# Patient Record
Sex: Male | Born: 1980 | Race: Black or African American | Hispanic: No | Marital: Single | Smoking: Never smoker
Health system: Southern US, Community
[De-identification: ages and names within clinical notes are randomized; demographics above are authoritative.]

## PROBLEM LIST (undated history)

## (undated) ENCOUNTER — Emergency Department (HOSPITAL_COMMUNITY): Discharge status: Absent without leave | Payer: BC Managed Care – PPO

---

## 2006-03-18 ENCOUNTER — Emergency Department (HOSPITAL_COMMUNITY): Admission: EM | Admit: 2006-03-18 | Discharge: 2006-03-18 | Payer: Self-pay | Admitting: Emergency Medicine

## 2007-01-20 ENCOUNTER — Ambulatory Visit: Payer: Self-pay | Admitting: Internal Medicine

## 2007-01-21 ENCOUNTER — Ambulatory Visit: Payer: Self-pay | Admitting: *Deleted

## 2007-02-03 ENCOUNTER — Ambulatory Visit: Payer: Self-pay | Admitting: Internal Medicine

## 2007-02-18 ENCOUNTER — Ambulatory Visit: Payer: Self-pay | Admitting: Internal Medicine

## 2009-06-10 ENCOUNTER — Emergency Department (HOSPITAL_COMMUNITY): Admission: EM | Admit: 2009-06-10 | Discharge: 2009-06-11 | Payer: Self-pay | Admitting: Emergency Medicine

## 2009-06-15 ENCOUNTER — Emergency Department (HOSPITAL_COMMUNITY): Admission: EM | Admit: 2009-06-15 | Discharge: 2009-06-15 | Payer: Self-pay | Admitting: Emergency Medicine

## 2011-09-03 ENCOUNTER — Emergency Department (HOSPITAL_COMMUNITY)
Admission: EM | Admit: 2011-09-03 | Discharge: 2011-09-03 | Disposition: A | Discharge status: Home / Self Care | Payer: No Typology Code available for payment source | Attending: Emergency Medicine | Admitting: Emergency Medicine

## 2011-09-03 ENCOUNTER — Emergency Department (HOSPITAL_COMMUNITY): Payer: No Typology Code available for payment source

## 2011-09-03 ENCOUNTER — Encounter (HOSPITAL_COMMUNITY): Payer: Self-pay | Admitting: Emergency Medicine

## 2011-09-03 DIAGNOSIS — S43109A Unspecified dislocation of unspecified acromioclavicular joint, initial encounter: Secondary | ICD-10-CM | POA: Insufficient documentation

## 2011-09-03 DIAGNOSIS — Y92410 Unspecified street and highway as the place of occurrence of the external cause: Secondary | ICD-10-CM | POA: Insufficient documentation

## 2011-09-03 DIAGNOSIS — M542 Cervicalgia: Secondary | ICD-10-CM | POA: Insufficient documentation

## 2011-09-03 MED ORDER — OXYCODONE-ACETAMINOPHEN 5-325 MG PO TABS
1.0000 | ORAL_TABLET | Freq: Once | ORAL | Status: AC
  Administered 2011-09-03: 1 via ORAL
  Filled 2011-09-03: qty 1

## 2011-09-03 MED ORDER — IBUPROFEN 800 MG PO TABS: 800.0000 mg | ORAL_TABLET | Freq: Three times a day (TID) | ORAL | Status: AC | PRN

## 2011-09-03 MED ORDER — IBUPROFEN 200 MG PO TABS
600.0000 mg | ORAL_TABLET | Freq: Once | ORAL | Status: AC
  Administered 2011-09-03: 600 mg via ORAL
  Filled 2011-09-03: qty 3

## 2011-09-03 MED ORDER — DIAZEPAM 5 MG PO TABS: 5.0000 mg | ORAL_TABLET | Freq: Three times a day (TID) | ORAL | Status: AC | PRN

## 2011-09-03 MED ORDER — IBUPROFEN 200 MG PO TABS
ORAL_TABLET | ORAL | Status: AC
  Filled 2011-09-03: qty 1

## 2011-09-03 MED ORDER — OXYCODONE-ACETAMINOPHEN 5-325 MG PO TABS: 1.0000 | ORAL_TABLET | Freq: Four times a day (QID) | ORAL | Status: AC | PRN

## 2011-09-03 MED ORDER — DIAZEPAM 5 MG PO TABS
5.0000 mg | ORAL_TABLET | Freq: Once | ORAL | Status: AC
  Administered 2011-09-03: 5 mg via ORAL
  Filled 2011-09-03: qty 1

## 2011-09-03 NOTE — ED Provider Notes (Signed)
Medical screening examination/treatment/procedure(s) were performed by non-physician practitioner and as supervising physician I was immediately available for consultation/collaboration.  Juliet Rude. Rubin Payor, MD 09/03/11 2358
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
## 2011-09-03 NOTE — ED Notes (Signed)
Pt returned from xray
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
## 2012-06-27 ENCOUNTER — Emergency Department (INDEPENDENT_AMBULATORY_CARE_PROVIDER_SITE_OTHER)
Admission: EM | Admit: 2012-06-27 | Discharge: 2012-06-27 | Disposition: A | Discharge status: Home / Self Care | Payer: Self-pay | Source: Home / Self Care | Attending: Family Medicine | Admitting: Family Medicine

## 2012-06-27 ENCOUNTER — Encounter (HOSPITAL_COMMUNITY): Payer: Self-pay | Admitting: Emergency Medicine

## 2012-06-27 DIAGNOSIS — M12819 Other specific arthropathies, not elsewhere classified, unspecified shoulder: Secondary | ICD-10-CM

## 2012-06-27 MED ORDER — DICLOFENAC SODIUM 1 % TD GEL: 4.0000 g | Freq: Four times a day (QID) | TRANSDERMAL | Status: DC

## 2012-06-27 NOTE — ED Provider Notes (Signed)
History     CSN: [id number]  Arrival date & time 06/27/12  1418   First MD Initiated Contact with Patient 06/27/12 1452      Chief Complaint  Patient presents with  . Shoulder Pain    (Consider location/radiation/quality/duration/timing/severity/associated sxs/prior treatment) Patient is a 32 y.o. male presenting with shoulder pain. The history is provided by the patient.  Shoulder Pain This is a chronic problem. The current episode started more than 1 week ago (74mo ago wrestling with friends in so [place], shoulder has hurt since.). The problem has not changed since onset.   History reviewed. No pertinent past medical history.  History reviewed. No pertinent past surgical history.  No family history on file.  History  Substance Use Topics  . Smoking status: Never Smoker   . Smokeless tobacco: Not on file  . Alcohol Use: No      Review of Systems  Constitutional: Negative.   Musculoskeletal: Positive for joint swelling.    Allergies  Review of patient's allergies indicates no known allergies.  Home Medications   Current Outpatient Rx  Name  Route  Sig  Dispense  Refill  . DICLOFENAC SODIUM 1 % TD GEL   Topical   Apply 4 g topically 4 (four) times daily.   3 Tube   0   . MAGNESIUM CHLORIDE 64 MG PO TBEC   Oral   Take 2 tablets by mouth 2 (two) times daily.           BP 135/89  Pulse 65  Temp 98.5 F (36.9 C) (Oral)  Resp 20  SpO2 100%  Physical Exam  Nursing note and vitals reviewed. Constitutional: He is oriented to person, place, and time. He appears well-developed and well-nourished. No distress.  Musculoskeletal: He exhibits tenderness.       Left shoulder: He exhibits tenderness and swelling. He exhibits normal range of motion, no bony tenderness, no effusion, no crepitus, no deformity, no pain and normal pulse.       Arms: Neurological: He is alert and oriented to person, place, and time.  Skin: Skin is warm and dry.    ED Course    Procedures (including critical care time)  Labs Reviewed - No data to display No results found.   1. Transient arthropathy, shoulder region       MDM          Linna Hoff, MD 06/27/12 1545
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
## 2013-12-17 ENCOUNTER — Emergency Department (HOSPITAL_COMMUNITY)
Admission: EM | Admit: 2013-12-17 | Discharge: 2013-12-17 | Disposition: A | Discharge status: Home / Self Care | Payer: BC Managed Care – PPO | Source: Home / Self Care | Attending: Family Medicine | Admitting: Family Medicine

## 2013-12-17 ENCOUNTER — Encounter (HOSPITAL_COMMUNITY): Payer: Self-pay | Admitting: Emergency Medicine

## 2013-12-17 DIAGNOSIS — J069 Acute upper respiratory infection, unspecified: Secondary | ICD-10-CM

## 2013-12-17 MED ORDER — DICLOFENAC SODIUM 50 MG PO TBEC: 50.0000 mg | DELAYED_RELEASE_TABLET | Freq: Two times a day (BID) | ORAL | Status: DC | PRN

## 2013-12-17 NOTE — ED Notes (Signed)
Patient complains of headache Dizzy and nausea the past three days

## 2013-12-17 NOTE — ED Provider Notes (Signed)
Darrell Lozano is a 33 y.o. male who presents to Urgent Care today for headache cough congestion mild upset stomach and fatigue. Symptoms present for 2 days. No nausea vomiting or diarrhea. He has not tried any medications yet. He denies any shortness of breath chest pain or palpitations. He feels well otherwise. Patient has mild dizziness. He denies any syncope or vertigo.   History reviewed. No pertinent past medical history. History  Substance Use Topics  . Smoking status: Never Smoker   . Smokeless tobacco: Not on file  . Alcohol Use: No   ROS as above Medications: No current facility-administered medications for this encounter.   Current Outpatient Prescriptions  Medication Sig Dispense Refill  . diclofenac (VOLTAREN) 50 MG EC tablet Take 1 tablet (50 mg total) by mouth 2 (two) times daily as needed.  30 tablet  0  . diclofenac sodium (VOLTAREN) 1 % GEL Apply 4 g topically 4 (four) times daily.  3 Tube  0  . magnesium chloride (SLOW-MAG) 64 MG TBEC Take 2 tablets by mouth 2 (two) times daily.        Exam:  BP 142/86  Pulse 56  Temp(Src) 98.6 F (37 C) (Oral)  Resp 16  SpO2 99% Gen: Well NAD HEENT: EOMI,  MMM PERRLA normal posterior pharynx and tympanic membranes Lungs: Normal work of breathing. CTABL Heart: RRR no MRG Abd: NABS, Soft. Nondistended, Nontender Exts: Brisk capillary refill, warm and well perfused.  Neuro: Alert and oriented cranial nerves intact normal coordination strength reflexes sensation gait and balance.  No results found for this or any previous visit (from the past 24 hour(s)). No results found.  Assessment and Plan: 33 y.o. male with viral URI. Plan for symptomatic management with diclofenac. Watchful waiting. Followup with primary care  Discussed warning signs or symptoms. Please see discharge instructions. Patient expresses understanding.   This note was created using Conservation officer, historic buildingsDragon voice recognition software. Any transcription errors are unintended.     Rodolph BongEvan S Stephanie Mcglone, MD 12/17/13 [id number]

## 2013-12-17 NOTE — Discharge Instructions (Signed)
Thank you for coming in today. Go to the emergency room if your headache becomes excruciating or you have weakness or numbness or uncontrolled vomiting.   Upper Respiratory Infection, Adult An upper respiratory infection (URI) is also sometimes known as the common cold. The upper respiratory tract includes the nose, sinuses, throat, trachea, and bronchi. Bronchi are the airways leading to the lungs. Most people improve within 1 week, but symptoms can last up to 2 weeks. A residual cough may last even longer.  CAUSES Many different viruses can infect the tissues lining the upper respiratory tract. The tissues become irritated and inflamed and often become very moist. Mucus production is also common. A cold is contagious. You can easily spread the virus to others by oral contact. This includes kissing, sharing a glass, coughing, or sneezing. Touching your mouth or nose and then touching a surface, which is then touched by another person, can also spread the virus. SYMPTOMS  Symptoms typically develop 1 to 3 days after you come in contact with a cold virus. Symptoms vary from person to person. They may include:  Runny nose.  Sneezing.  Nasal congestion.  Sinus irritation.  Sore throat.  Loss of voice (laryngitis).  Cough.  Fatigue.  Muscle aches.  Loss of appetite.  Headache.  Low-grade fever. DIAGNOSIS  You might diagnose your own cold based on familiar symptoms, since most people get a cold 2 to 3 times a year. Your caregiver can confirm this based on your exam. Most importantly, your caregiver can check that your symptoms are not due to another disease such as strep throat, sinusitis, pneumonia, asthma, or epiglottitis. Blood tests, throat tests, and X-rays are not necessary to diagnose a common cold, but they may sometimes be helpful in excluding other more serious diseases. Your caregiver will decide if any further tests are required. RISKS AND COMPLICATIONS  You may be at risk  for a more severe case of the common cold if you smoke cigarettes, have chronic heart disease (such as heart failure) or lung disease (such as asthma), or if you have a weakened immune system. The very young and very old are also at risk for more serious infections. Bacterial sinusitis, middle ear infections, and bacterial pneumonia can complicate the common cold. The common cold can worsen asthma and chronic obstructive pulmonary disease (COPD). Sometimes, these complications can require emergency medical care and may be life-threatening. PREVENTION  The best way to protect against getting a cold is to practice good hygiene. Avoid oral or hand contact with people with cold symptoms. Wash your hands often if contact occurs. There is no clear evidence that vitamin C, vitamin E, echinacea, or exercise reduces the chance of developing a cold. However, it is always recommended to get plenty of rest and practice good nutrition. TREATMENT  Treatment is directed at relieving symptoms. There is no cure. Antibiotics are not effective, because the infection is caused by a virus, not by bacteria. Treatment may include:  Increased fluid intake. Sports drinks offer valuable electrolytes, sugars, and fluids.  Breathing heated mist or steam (vaporizer or shower).  Eating chicken soup or other clear broths, and maintaining good nutrition.  Getting plenty of rest.  Using gargles or lozenges for comfort.  Controlling fevers with ibuprofen or acetaminophen as directed by your caregiver.  Increasing usage of your inhaler if you have asthma. Zinc gel and zinc lozenges, taken in the first 24 hours of the common cold, can shorten the duration and lessen the severity of  symptoms. Pain medicines may help with fever, muscle aches, and throat pain. A variety of non-prescription medicines are available to treat congestion and runny nose. Your caregiver can make recommendations and may suggest nasal or lung inhalers for other  symptoms.  HOME CARE INSTRUCTIONS   Only take over-the-counter or prescription medicines for pain, discomfort, or fever as directed by your caregiver.  Use a warm mist humidifier or inhale steam from a shower to increase air moisture. This may keep secretions moist and make it easier to breathe.  Drink enough water and fluids to keep your urine clear or pale yellow.  Rest as needed.  Return to work when your temperature has returned to normal or as your caregiver advises. You may need to stay home longer to avoid infecting others. You can also use a face mask and careful hand washing to prevent spread of the virus. SEEK MEDICAL CARE IF:   After the first few days, you feel you are getting worse rather than better.  You need your caregiver's advice about medicines to control symptoms.  You develop chills, worsening shortness of breath, or brown or red sputum. These may be signs of pneumonia.  You develop yellow or brown nasal discharge or pain in the face, especially when you bend forward. These may be signs of sinusitis.  You develop a fever, swollen neck glands, pain with swallowing, or white areas in the back of your throat. These may be signs of strep throat. SEEK IMMEDIATE MEDICAL CARE IF:   You have a fever.  You develop severe or persistent headache, ear pain, sinus pain, or chest pain.  You develop wheezing, a prolonged cough, cough up blood, or have a change in your usual mucus (if you have chronic lung disease).  You develop sore muscles or a stiff neck. Document Released: 11/11/2000 Document Revised: 08/10/2011 Document Reviewed: 09/19/2010 [facility] Patient Information 2015 [place]. This information is not intended to replace advice given to you by your health care provider. Make sure you discuss any questions you have with your health care provider.

## 2014-11-29 ENCOUNTER — Ambulatory Visit (INDEPENDENT_AMBULATORY_CARE_PROVIDER_SITE_OTHER): Payer: Worker's Compensation | Admitting: Family

## 2014-11-29 ENCOUNTER — Encounter: Payer: Self-pay | Admitting: Family

## 2014-11-29 VITALS — BP 120/81 | HR 70 | Temp 98.4°F | Ht 74.0 in

## 2014-11-29 DIAGNOSIS — S41112A Laceration without foreign body of left upper arm, initial encounter: Secondary | ICD-10-CM | POA: Diagnosis not present

## 2014-11-29 DIAGNOSIS — Z23 Encounter for immunization: Secondary | ICD-10-CM | POA: Diagnosis not present

## 2014-11-29 DIAGNOSIS — IMO0002 Reserved for concepts with insufficient information to code with codable children: Secondary | ICD-10-CM

## 2014-11-29 MED ORDER — SULFAMETHOXAZOLE-TRIMETHOPRIM 800-160 MG PO TABS: 1.0000 | ORAL_TABLET | Freq: Two times a day (BID) | ORAL | Status: DC

## 2014-11-29 NOTE — Patient Instructions (Signed)
Laceration Care, Adult A laceration is a cut or lesion that goes through all layers of the skin and into the tissue just beneath the skin. TREATMENT  Some lacerations may not require closure. Some lacerations may not be able to be closed due to an increased risk of infection. It is important to see your caregiver as soon as possible after an injury to minimize the risk of infection and maximize the opportunity for successful closure. If closure is appropriate, pain medicines may be given, if needed. The wound will be cleaned to help prevent infection. Your caregiver will use stitches (sutures), staples, wound glue (adhesive), or skin adhesive strips to repair the laceration. These tools bring the skin edges together to allow for faster healing and a better cosmetic outcome. However, all wounds will heal with a scar. Once the wound has healed, scarring can be minimized by covering the wound with sunscreen during the day for 1 full year. HOME CARE INSTRUCTIONS  For sutures or staples:  Keep the wound clean and dry.  If you were given a bandage (dressing), you should change it at least once a day. Also, change the dressing if it becomes wet or dirty, or as directed by your caregiver.  Wash the wound with soap and water 2 times a day. Rinse the wound off with water to remove all soap. Pat the wound dry with a clean towel.  After cleaning, apply a thin layer of the antibiotic ointment as recommended by your caregiver. This will help prevent infection and keep the dressing from sticking.  You may shower as usual after the first 24 hours. Do not soak the wound in water until the sutures are removed.  Only take over-the-counter or prescription medicines for pain, discomfort, or fever as directed by your caregiver.  Get your sutures or staples removed as directed by your caregiver. For skin adhesive strips:  Keep the wound clean and dry.  Do not get the skin adhesive strips wet. You may bathe  carefully, using caution to keep the wound dry.  If the wound gets wet, pat it dry with a clean towel.  Skin adhesive strips will fall off on their own. You may trim the strips as the wound heals. Do not remove skin adhesive strips that are still stuck to the wound. They will fall off in time. For wound adhesive:  You may briefly wet your wound in the shower or bath. Do not soak or scrub the wound. Do not swim. Avoid periods of heavy perspiration until the skin adhesive has fallen off on its own. After showering or bathing, gently pat the wound dry with a clean towel.  Do not apply liquid medicine, cream medicine, or ointment medicine to your wound while the skin adhesive is in place. This may loosen the film before your wound is healed.  If a dressing is placed over the wound, be careful not to apply tape directly over the skin adhesive. This may cause the adhesive to be pulled off before the wound is healed.  Avoid prolonged exposure to sunlight or tanning lamps while the skin adhesive is in place. Exposure to ultraviolet light in the first year will darken the scar.  The skin adhesive will usually remain in place for 5 to 10 days, then naturally fall off the skin. Do not pick at the adhesive film. You may need a tetanus shot if:  You cannot remember when you had your last tetanus shot.  You have never had a tetanus  shot. If you get a tetanus shot, your arm may swell, get red, and feel warm to the touch. This is common and not a problem. If you need a tetanus shot and you choose not to have one, there is a rare chance of getting tetanus. Sickness from tetanus can be serious. SEEK MEDICAL CARE IF:   You have redness, swelling, or increasing pain in the wound.  You see a red line that goes away from the wound.  You have yellowish-white fluid (pus) coming from the wound.  You have a fever.  You notice a bad smell coming from the wound or dressing.  Your wound breaks open before or  after sutures have been removed.  You notice something coming out of the wound such as wood or glass.  Your wound is on your hand or foot and you cannot move a finger or toe. SEEK IMMEDIATE MEDICAL CARE IF:   Your pain is not controlled with prescribed medicine.  You have severe swelling around the wound causing pain and numbness or a change in color in your arm, hand, leg, or foot.  Your wound splits open and starts bleeding.  You have worsening numbness, weakness, or loss of function of any joint around or beyond the wound.  You develop painful lumps near the wound or on the skin anywhere on your body. MAKE SURE YOU:   Understand these instructions.  Will watch your condition.  Will get help right away if you are not doing well or get worse. Document Released: 05/18/2005 Document Revised: 08/10/2011 Document Reviewed: 11/11/2010 [facility] Patient Information 2015 [place], [place]. This information is not intended to replace advice given to you by your health care provider. Make sure you discuss any questions you have with your health care provider.  Diphtheria Toxoid; Tetanus Toxoid Adsorbed, DT, Td What is this medicine? DIPHTHERIA AND TETANUS TOXOIDS ADSORBED (dif THEER ee uh and TET n [place] TOK soids ad SAWRB) is a vaccine. It is used to prevent infections of diphtheria and tetanus (lockjaw). This medicine may be used for other purposes; ask your health care provider or pharmacist if you have questions. COMMON BRAND NAME(S): DECAVAC, TENIVAC What should I tell my health care provider before I take this medicine? They need to know if you have any of these conditions: -bleeding disorder -immune system problems -infection with fever -low levels of platelets in the blood -an unusual or allergic reaction to diphtheria or tetanus toxoid, latex, thimerosal, other medicines, foods, dyes, or preservatives -pregnant or trying to get pregnant -breast-feeding How should I use this  medicine? This vaccine is for injection into a muscle. It is given by a health care professional. A copy of Vaccine Information Statements will be given before each vaccination. Read this sheet carefully each time. The sheet may change frequently. Talk to your pediatrician regarding the use of this medicine in children. While this drug may be prescribed for selected conditions, precautions do apply. Overdosage: If you think you have taken too much of this medicine contact a poison control center or emergency room at once. NOTE: This medicine is only for you. Do not share this medicine with others. What if I miss a dose? Keep appointments for follow-up (booster) doses as directed. It is important not to miss your dose. Call your doctor or health care professional if you are unable to keep an appointment. What may interact with this medicine? -adalimumab -anakinra -infliximab -live vaccines -medicines that suppress your immune system -medicines to treat cancer -  medicines that treat or prevent blood clots like daily aspirin, enoxaparin, heparin, ticlopidine, warfarin -radiopharmaceuticals like iodine I-125 or I-131 This list may not describe all possible interactions. Give your health care provider a list of all the medicines, herbs, non-prescription drugs, or dietary supplements you use. Also tell them if you smoke, drink alcohol, or use illegal drugs. Some items may interact with your medicine. What should I watch for while using this medicine? Contact your doctor or health care professional and seek emergency medical care if any serious side effects occur. This vaccine, like all vaccines, may not fully protect everyone. What side effects may I notice from receiving this medicine? Side effects that you should report to your doctor or health care professional as soon as possible: -allergic reactions like skin rash, itching or hives, swelling of the face, lips, or tongue -arthritis  pain -breathing problems -changes in hearing -extreme changes in behavior -fast, irregular heartbeat -fever over 100 degrees F -pain, tingling, numbness in the hands or feet -seizures -unusually weak or tired Side effects that usually do not require medical attention (report to your doctor or health care professional if they continue or are bothersome): -aches or pains -bruising, pain, swelling at site where injected -headache -loss of appetite -low-grade fever of 100 degrees F or less -nausea, vomiting -sleepy -swollen glands This list may not describe all possible side effects. Call your doctor for medical advice about side effects. You may report side effects to FDA at 1-800-FDA-1088. Where should I keep my medicine? This drug is given in a hospital or clinic and will not be stored at home. NOTE: This sheet is a summary. It may not cover all possible information. If you have questions about this medicine, talk to your doctor, pharmacist, or health care provider.  2015, Elsevier/Gold Standard. (2007-09-15 13:57:36)
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
## 2014-12-10 ENCOUNTER — Encounter: Payer: Self-pay | Admitting: Family

## 2014-12-10 ENCOUNTER — Ambulatory Visit (INDEPENDENT_AMBULATORY_CARE_PROVIDER_SITE_OTHER): Payer: Worker's Compensation | Admitting: Family

## 2014-12-10 VITALS — BP 124/83 | HR 65 | Temp 97.7°F | Ht 74.0 in | Wt 236.6 lb

## 2014-12-10 DIAGNOSIS — Z4802 Encounter for removal of sutures: Secondary | ICD-10-CM

## 2014-12-10 DIAGNOSIS — S41112D Laceration without foreign body of left upper arm, subsequent encounter: Secondary | ICD-10-CM | POA: Diagnosis not present

## 2014-12-10 NOTE — Progress Notes (Signed)
   Subjective:    Patient ID: Darrell Lozano, male    DOB: 06/04/1980, 34 y.o.   MRN: [id number]  HPI Pt presents to the office today for 8 sutures to be removed. Pt denies any fever, drainage, redness, or warmth.    Review of Systems  Constitutional: Negative.   HENT: Negative.   Respiratory: Negative.   Cardiovascular: Negative.   Gastrointestinal: Negative.   Endocrine: Negative.   Genitourinary: Negative.   Musculoskeletal: Negative.   Neurological: Negative.   Hematological: Negative.   Psychiatric/Behavioral: Negative.   All other systems reviewed and are negative.      Objective:   Physical Exam  Constitutional: He is oriented to person, place, and time. He appears well-developed and well-nourished. No distress.  HENT:  Head: Normocephalic.  Right Ear: External ear normal.  Left Ear: External ear normal.  Mouth/Throat: Oropharynx is clear and moist.  Eyes: Pupils are equal, round, and reactive to light. Right eye exhibits no discharge. Left eye exhibits no discharge.  Neck: Normal range of motion. Neck supple. No thyromegaly present.  Cardiovascular: Normal rate, regular rhythm, normal heart sounds and intact distal pulses.   No murmur heard. Pulmonary/Chest: Effort normal and breath sounds normal. No respiratory distress. He has no wheezes.  Abdominal: Soft. Bowel sounds are normal. He exhibits no distension. There is no tenderness.  Musculoskeletal: Normal range of motion. He exhibits no edema or tenderness.  Neurological: He is alert and oriented to person, place, and time. He has normal reflexes. No cranial nerve deficit.  Skin: Skin is warm and dry. Laceration noted. No rash noted. No erythema.  8 sutures present in left upper arm- No drainage, redness, or warmth present at site  Psychiatric: He has a normal mood and affect. His behavior is normal. Judgment and thought content normal.  Vitals reviewed.   BP 124/83 mmHg  Pulse 65  Temp(Src) 97.7 F (36.5 C)  (Oral)  Ht 6\' 2"  (1.88 m)  Wt 236 lb 9.6 oz (107.321 kg)  BMI 30.36 kg/m2       Assessment & Plan:  1. Visit for suture removal -Keep area clean and dry -S/S of infection discussed -RTO prn  Jannifer Rodneyhristy Javoni Lucken, FNP

## 2014-12-10 NOTE — Patient Instructions (Signed)

## 2014-12-12 ENCOUNTER — Telehealth: Payer: Self-pay | Admitting: Family

## 2014-12-12 NOTE — Telephone Encounter (Signed)
Spoke with pt regarding referral Informed pt since new problem [facility] Pt verbalizes understanding

## 2015-11-23 ENCOUNTER — Emergency Department (HOSPITAL_COMMUNITY)
Admission: EM | Admit: 2015-11-23 | Discharge: 2015-11-23 | Disposition: A | Discharge status: Home / Self Care | Payer: No Typology Code available for payment source | Attending: Emergency Medicine | Admitting: Emergency Medicine

## 2015-11-23 ENCOUNTER — Emergency Department (HOSPITAL_COMMUNITY): Payer: No Typology Code available for payment source

## 2015-11-23 ENCOUNTER — Encounter (HOSPITAL_COMMUNITY): Payer: Self-pay | Admitting: Emergency Medicine

## 2015-11-23 DIAGNOSIS — Y999 Unspecified external cause status: Secondary | ICD-10-CM | POA: Diagnosis not present

## 2015-11-23 DIAGNOSIS — Y939 Activity, unspecified: Secondary | ICD-10-CM | POA: Insufficient documentation

## 2015-11-23 DIAGNOSIS — M25512 Pain in left shoulder: Secondary | ICD-10-CM | POA: Insufficient documentation

## 2015-11-23 DIAGNOSIS — M62838 Other muscle spasm: Secondary | ICD-10-CM

## 2015-11-23 DIAGNOSIS — Y92410 Unspecified street and highway as the place of occurrence of the external cause: Secondary | ICD-10-CM | POA: Insufficient documentation

## 2015-11-23 DIAGNOSIS — M79605 Pain in left leg: Secondary | ICD-10-CM | POA: Diagnosis not present

## 2015-11-23 DIAGNOSIS — M542 Cervicalgia: Secondary | ICD-10-CM | POA: Diagnosis not present

## 2015-11-23 MED ORDER — IBUPROFEN 800 MG PO TABS
800.0000 mg | ORAL_TABLET | Freq: Once | ORAL | Status: AC
  Administered 2015-11-23: 800 mg via ORAL
  Filled 2015-11-23: qty 1

## 2015-11-23 MED ORDER — IBUPROFEN 800 MG PO TABS: 800.0000 mg | ORAL_TABLET | Freq: Three times a day (TID) | ORAL | Status: DC | PRN

## 2015-11-23 MED ORDER — METHOCARBAMOL 500 MG PO TABS: 500.0000 mg | ORAL_TABLET | Freq: Three times a day (TID) | ORAL | Status: DC | PRN
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
## 2017-07-18 ENCOUNTER — Other Ambulatory Visit: Payer: Self-pay

## 2017-07-18 ENCOUNTER — Ambulatory Visit (HOSPITAL_COMMUNITY)
Admission: EM | Admit: 2017-07-18 | Discharge: 2017-07-18 | Disposition: A | Discharge status: Home / Self Care | Payer: BLUE CROSS/BLUE SHIELD | Attending: Internal Medicine | Admitting: Internal Medicine

## 2017-07-18 ENCOUNTER — Ambulatory Visit (HOSPITAL_COMMUNITY)
Admission: EM | Admit: 2017-07-18 | Discharge: 2017-07-18 | Discharge status: Against Medical Advice | Payer: BLUE CROSS/BLUE SHIELD

## 2017-07-18 ENCOUNTER — Encounter (HOSPITAL_COMMUNITY): Payer: Self-pay | Admitting: *Deleted

## 2017-07-18 DIAGNOSIS — J111 Influenza due to unidentified influenza virus with other respiratory manifestations: Secondary | ICD-10-CM

## 2017-07-18 DIAGNOSIS — R69 Illness, unspecified: Secondary | ICD-10-CM

## 2017-07-18 DIAGNOSIS — H6692 Otitis media, unspecified, left ear: Secondary | ICD-10-CM

## 2017-07-18 MED ORDER — AMOXICILLIN 875 MG PO TABS: 875.0000 mg | ORAL_TABLET | Freq: Two times a day (BID) | ORAL | 0 refills | Status: AC

## 2017-07-18 MED ORDER — ONDANSETRON HCL 4 MG PO TABS: 8.0000 mg | ORAL_TABLET | ORAL | 0 refills | Status: DC | PRN

## 2017-07-18 NOTE — ED Notes (Signed)
Per pt access, pt left 

## 2017-07-18 NOTE — Discharge Instructions (Addendum)
Anticipate gradual improvement in diarrhea, runny nose/head congestion, and well-being, over the next several days.  It may take a couple of weeks for bowel pattern to return to normal.  Prescriptions for Zofran, for nausea, and for amoxicillin, for left ear infection, sent to the pharmacy.  Note for work, return to work on Tuesday 2/19.  Push fluids and rest.  Diet as tolerated.

## 2017-07-18 NOTE — ED Triage Notes (Signed)
C/O cough, congestion x 4 days with tactile fever.  C/O diarrhea x 3 days.  Denies vomiting.

## 2017-07-18 NOTE — ED Provider Notes (Signed)
[facility]    CSN: [id number] Arrival date & time: 07/18/17  1507     History   Chief Complaint Chief Complaint  Patient presents with  . Cough  . Fever  . Diarrhea    HPI Darrell Lozano is a 37 y.o. male.   He presents today with 3-4-day history of chills and tactile temperature, runny/congested nose, little bit of cough, little bit of sore throat, diarrhea.  Malaise.  Works in [employer], and has missed a couple days of work.  Has some nausea but is keeping down water and ginger ale.    HPI  History reviewed. No pertinent past medical history.  History reviewed. No pertinent surgical history.     Home Medications    Prior to Admission medications   Medication Sig Start Date End Date Taking? Authorizing Provider  amoxicillin (AMOXIL) 875 MG tablet Take 1 tablet (875 mg total) by mouth 2 (two) times daily for 7 days. 07/18/17 07/25/17  Isa RankinMurray, Damiya Sandefur Wilson, MD  ibuprofen (ADVIL,MOTRIN) 800 MG tablet Take 1 tablet (800 mg total) by mouth every 8 (eight) hours as needed for mild pain. 11/23/15   Ward, Layla MawKristen N, DO  methocarbamol (ROBAXIN) 500 MG tablet Take 1 tablet (500 mg total) by mouth every 8 (eight) hours as needed for muscle spasms. 11/23/15   Ward, Layla MawKristen N, DO  ondansetron (ZOFRAN) 4 MG tablet Take 2 tablets (8 mg total) by mouth every 4 (four) hours as needed for nausea or vomiting. 07/18/17   Isa RankinMurray, Cammeron Greis Wilson, MD    Family History History reviewed. No pertinent family history.  Social History Social History   Tobacco Use  . Smoking status: Never Smoker  . Smokeless tobacco: Never Used  Substance Use Topics  . Alcohol use: No  . Drug use: No     Allergies   Patient has no known allergies.   Review of Systems Review of Systems  All other systems reviewed and are negative.    Physical Exam Triage Vital Signs ED Triage Vitals  Enc Vitals Group     BP 07/18/17 1616 (!) 141/92     Pulse Rate 07/18/17 1616 81     Resp 07/18/17 1616 16       Temp 07/18/17 1616 (!) 97.3 F (36.3 C)     Temp Source 07/18/17 1616 Oral     SpO2 07/18/17 1616 97 %     Weight --      Height --      Pain Score 07/18/17 1617 8     Pain Loc --    Updated Vital Signs BP (!) 141/92   Pulse 81   Temp (!) 97.3 F (36.3 C) (Oral)   Resp 16   SpO2 97%   Physical Exam  Constitutional: He is oriented to person, place, and time. No distress.  Alert, nicely groomed  HENT:  Head: Atraumatic.  Left TM is dull and red, right TM is dull Marked nasal congestion on the left, mild to moderate congestion on the right Throat is injected with clear postnasal drainage  Eyes:  Conjugate gaze, no eye redness/drainage  Neck: Neck supple.  Cardiovascular: Normal rate and regular rhythm.  Pulmonary/Chest: No respiratory distress. He has no wheezes. He has no rales.  Lungs clear, symmetric breath sounds  Abdominal: Soft. He exhibits no distension. There is no tenderness. There is no rebound and no guarding.  Musculoskeletal: Normal range of motion.  Neurological: He is alert and oriented to person, place, and time.  Skin: Skin is warm and dry.  No cyanosis  Nursing note and vitals reviewed.    UC Treatments / Results   Procedures Procedures (including critical care time) None today  Final Clinical Impressions(s) / UC Diagnoses   Final diagnoses:  Influenza-like illness  Acute left otitis media   Anticipate gradual improvement in diarrhea, runny nose/head congestion, and well-being, over the next several days.  It may take a couple of weeks for bowel pattern to return to normal.  Prescriptions for Zofran, for nausea, and for amoxicillin, for left ear infection, sent to the pharmacy.  Note for work, return to work on Tuesday 2/19.  Push fluids and rest.  Diet as tolerated.  ED Discharge Orders        Ordered    ondansetron (ZOFRAN) 4 MG tablet  Every 4 hours PRN     07/18/17 1654    amoxicillin (AMOXIL) 875 MG tablet  2 times daily     07/18/17  1654         Isa Rankin, MD 07/19/17 2130

## 2018-06-03 ENCOUNTER — Encounter: Payer: Self-pay | Admitting: Family

## 2019-06-04 ENCOUNTER — Ambulatory Visit (HOSPITAL_COMMUNITY)
Admission: EM | Admit: 2019-06-04 | Discharge: 2019-06-04 | Disposition: A | Discharge status: Home / Self Care | Payer: BC Managed Care – PPO | Attending: Urgent Care | Admitting: Urgent Care

## 2019-06-04 ENCOUNTER — Encounter (HOSPITAL_COMMUNITY): Payer: Self-pay

## 2019-06-04 ENCOUNTER — Other Ambulatory Visit: Payer: Self-pay

## 2019-06-04 DIAGNOSIS — Z20822 Contact with and (suspected) exposure to covid-19: Secondary | ICD-10-CM | POA: Insufficient documentation

## 2019-06-04 DIAGNOSIS — R112 Nausea with vomiting, unspecified: Secondary | ICD-10-CM

## 2019-06-04 DIAGNOSIS — K529 Noninfective gastroenteritis and colitis, unspecified: Secondary | ICD-10-CM | POA: Diagnosis not present

## 2019-06-04 DIAGNOSIS — R197 Diarrhea, unspecified: Secondary | ICD-10-CM

## 2019-06-04 MED ORDER — ONDANSETRON HCL 8 MG PO TABS: 8.0000 mg | ORAL_TABLET | Freq: Three times a day (TID) | ORAL | 0 refills | Status: DC | PRN

## 2019-06-04 NOTE — ED Triage Notes (Signed)
Patient presents to Urgent Care with complaints of nausea and vomiting since the past 4 days. Patient reports he has been taking pepto bismol but it has not helped.

## 2019-06-04 NOTE — ED Provider Notes (Signed)
[place]   MRN: [id number] DOB: 04/09/1981  Subjective:   Darrell Lozano is a 39 y.o. male presenting for 4 day hx of persistent nausea with vomiting.  Patient has also had intermittent diarrhea, about 1 episode per day.  He is trying to hydrate and eat light meals.  Wants to make sure he does not have COVID-19.  Has practice social distancing well.  Denies any direct contacts.  No recent antibiotic use.  No recent hospitalizations.  No other sick contacts with GI symptoms.  He is not currently taking any medications and has no known food or drug allergies.  Denies past medical and surgical history.   Family History  Problem Relation Age of Onset  . Healthy Mother   . Healthy Father     Social History   Tobacco Use  . Smoking status: Never Smoker  . Smokeless tobacco: Never Used  Substance Use Topics  . Alcohol use: No  . Drug use: No    Review of Systems  Constitutional: Negative for fever and malaise/fatigue.  HENT: Negative for congestion, ear pain, sinus pain and sore throat.   Eyes: Negative for discharge and redness.  Respiratory: Negative for cough, hemoptysis, shortness of breath and wheezing.   Cardiovascular: Negative for chest pain.  Gastrointestinal: Positive for diarrhea, nausea and vomiting. Negative for abdominal pain, blood in stool and constipation.  Genitourinary: Negative for dysuria, flank pain and hematuria.  Musculoskeletal: Negative for myalgias.  Skin: Negative for rash.  Neurological: Negative for dizziness, weakness and headaches.  Psychiatric/Behavioral: Negative for depression and substance abuse.     Objective:   Vitals: BP 135/85   Pulse 68   Temp 98.3 F (36.8 C) (Oral)   Resp 15   SpO2 98%   Physical Exam Constitutional:      General: He is not in acute distress.    Appearance: Normal appearance. He is well-developed. He is not ill-appearing, toxic-appearing or diaphoretic.  HENT:     Head: Normocephalic and  atraumatic.     Right Ear: External ear normal.     Left Ear: External ear normal.     Nose: Nose normal.     Mouth/Throat:     Mouth: Mucous membranes are moist.     Pharynx: Oropharynx is clear.  Eyes:     General: No scleral icterus.    Extraocular Movements: Extraocular movements intact.     Pupils: Pupils are equal, round, and reactive to light.  Cardiovascular:     Rate and Rhythm: Normal rate and regular rhythm.     Heart sounds: Normal heart sounds. No murmur. No friction rub. No gallop.   Pulmonary:     Effort: Pulmonary effort is normal. No respiratory distress.     Breath sounds: Normal breath sounds. No stridor. No wheezing, rhonchi or rales.  Abdominal:     General: Bowel sounds are normal. There is no distension.     Palpations: Abdomen is soft. There is no mass.     Tenderness: There is abdominal tenderness (Generalized throughout but mild in nature). There is no guarding or rebound.  Skin:    General: Skin is warm and dry.  Neurological:     Mental Status: He is alert and oriented to person, place, and time.  Psychiatric:        Mood and Affect: Mood normal.        Behavior: Behavior normal.        Thought Content: Thought content normal.  Assessment and Plan :   1. Gastroenteritis   2. Nausea vomiting and diarrhea     COVID-19 testing pending, will manage for gastroenteritis with supportive care.  Recommended patient hydrate well, eat light meals and maintain electrolytes.  Will use Zofran and Imodium for nausea, vomiting and diarrhea. Counseled patient on potential for adverse effects with medications prescribed/recommended today, ER and return-to-clinic precautions discussed, patient verbalized understanding.    Wallis Bamberg, PA-C 06/04/19 1351

## 2019-06-04 NOTE — Discharge Instructions (Addendum)
Make sure you push fluids drinking mostly water but mix it with Gatorade.  Try to eat light meals including soups, broths and soft foods, fruits.  You may use Zofran for your nausea and vomiting once every 8 hours.  Imodium (loperamide) can help with diarrhea but use this carefully limiting it to 1 times per day at 2mg  only if you are having a lot of diarrhea.

## 2019-06-04 NOTE — ED Notes (Signed)
Patient called several times to be brought to room for evaluation with no answer.

## 2019-06-04 NOTE — ED Notes (Signed)
Patient found in lobby.

## 2019-06-05 LAB — NOVEL CORONAVIRUS, NAA (HOSP ORDER, SEND-OUT TO REF LAB; TAT 18-24 HRS): SARS-CoV-2, NAA: NOT DETECTED

## 2019-06-17 ENCOUNTER — Emergency Department (HOSPITAL_COMMUNITY)
Admission: EM | Admit: 2019-06-17 | Discharge: 2019-06-17 | Disposition: A | Discharge status: Home / Self Care | Attending: Emergency Medicine | Admitting: Emergency Medicine

## 2019-06-17 ENCOUNTER — Other Ambulatory Visit: Payer: Self-pay

## 2019-06-17 DIAGNOSIS — Y999 Unspecified external cause status: Secondary | ICD-10-CM | POA: Diagnosis not present

## 2019-06-17 DIAGNOSIS — S39012A Strain of muscle, fascia and tendon of lower back, initial encounter: Secondary | ICD-10-CM | POA: Diagnosis not present

## 2019-06-17 DIAGNOSIS — Y9389 Activity, other specified: Secondary | ICD-10-CM | POA: Diagnosis not present

## 2019-06-17 DIAGNOSIS — Y929 Unspecified place or not applicable: Secondary | ICD-10-CM | POA: Insufficient documentation

## 2019-06-17 DIAGNOSIS — S3992XA Unspecified injury of lower back, initial encounter: Secondary | ICD-10-CM | POA: Diagnosis present

## 2019-06-17 DIAGNOSIS — X500XXA Overexertion from strenuous movement or load, initial encounter: Secondary | ICD-10-CM | POA: Insufficient documentation

## 2019-06-17 MED ORDER — DEXAMETHASONE 4 MG PO TABS: 4.0000 mg | ORAL_TABLET | Freq: Two times a day (BID) | ORAL | 0 refills | Status: DC

## 2019-06-17 NOTE — ED Triage Notes (Signed)
Patient reports he was lifting a bed frame at work a couple days ago and now has back pain in lower back, rated 8/10, aching, stabbing, shooting. This will be workmans comp visit.

## 2019-06-17 NOTE — ED Notes (Signed)
Pt ambulated to bathroom by NT for urine sample.

## 2019-06-17 NOTE — Discharge Instructions (Signed)
Continue to take ibuprofen 600 mg every 6 hours as needed for pain.

## 2019-06-17 NOTE — ED Notes (Signed)
Patient notified urine sample must be collected with staff notification due to condition being workmans comp related. Patient states he will let us know when he has to go to bathroom.

## 2019-06-18 NOTE — ED Provider Notes (Signed)
[name] DEPT Provider Note   CSN: [id number] Arrival date & time: 06/17/19  1424     History Chief Complaint  Patient presents with  . Back Pain    Darrell Lozano is a 39 y.o. male.  HPI    39 year old male with lower back pain.  Onset a couple days ago.  He reports it began after picking up a heavy bed frame.  Pain is in the right lower back.  Sometimes feels in his right buttock.  No numbness or tingling or loss of strength.  No incontinence or retention.  Is been taking some ibuprofen with mild relief.  No past medical history on file.  There are no problems to display for this patient.   No past surgical history on file.     Family History  Problem Relation Age of Onset  . Healthy Mother   . Healthy Father     Social History   Tobacco Use  . Smoking status: Never Smoker  . Smokeless tobacco: Never Used  Substance Use Topics  . Alcohol use: No  . Drug use: No    Home Medications Prior to Admission medications   Medication Sig Start Date End Date Taking? Authorizing Provider  dexamethasone (DECADRON) 4 MG tablet Take 1 tablet (4 mg total) by mouth 2 (two) times daily. 06/17/19   Virgel Manifold, MD  ibuprofen (ADVIL,MOTRIN) 800 MG tablet Take 1 tablet (800 mg total) by mouth every 8 (eight) hours as needed for mild pain. 11/23/15   Ward, Delice Bison, DO  ondansetron (ZOFRAN) 8 MG tablet Take 1 tablet (8 mg total) by mouth every 8 (eight) hours as needed for nausea or vomiting. 06/04/19   Jaynee Eagles, PA-C    Allergies    Patient has no known allergies.  Review of Systems   Review of Systems All systems reviewed and negative, other than as noted in HPI.  Physical .Exam Updated Vital Signs BP (!) 139/95   Pulse 87   Resp 19   Ht 6\' 3"  (1.905 m)   Wt 97.5 kg   SpO2 98%   BMI 26.87 kg/m   Physical Exam Vitals and nursing note reviewed.  Constitutional:      General: He is not in acute distress.    Appearance: He is  well-developed.  HENT:     Head: Normocephalic and atraumatic.  Eyes:     General:        Right eye: No discharge.        Left eye: No discharge.     Conjunctiva/sclera: Conjunctivae normal.  Cardiovascular:     Rate and Rhythm: Normal rate and regular rhythm.     Heart sounds: Normal heart sounds. No murmur. No friction rub. No gallop.   Pulmonary:     Effort: Pulmonary effort is normal. No respiratory distress.     Breath sounds: Normal breath sounds.  Abdominal:     General: There is no distension.     Palpations: Abdomen is soft.     Tenderness: There is no abdominal tenderness.  Musculoskeletal:        General: Tenderness present.     Cervical back: Neck supple.     Comments: Mild tenderness to palpation in the right lower lumbar region.  No midline spinal tenderness.  No overlying skin changes.  Normal-appearing gait.  Strength is 5 out of 5 bilateral lower extremities.  Sensation is intact to light touch.  Skin:    General: Skin is warm  and dry.  Neurological:     Mental Status: He is alert.  Psychiatric:        Behavior: Behavior normal.        Thought Content: Thought content normal.     ED Results / Procedures / Treatments   Labs (all labs ordered are listed, but only abnormal results are displayed) Labs Reviewed - No data to display  EKG None  Radiology No results found.  Procedures Procedures (including critical care time)  Medications Ordered in ED Medications - No data to display  ED Course  I have reviewed the triage vital signs and the nursing notes.  Pertinent labs & imaging results that were available during my care of the patient were reviewed by me and considered in my medical decision making (see chart for details).    MDM Rules/Calculators/A&P                      39 year old male with right lower back pain without red flags.  Symptomatic treatment.  Activity as tolerated.  Final Clinical Impression(s) / ED Diagnoses Final diagnoses:    Strain of lumbar region, initial encounter    Rx / DC Orders ED Discharge Orders         Ordered    dexamethasone (DECADRON) 4 MG tablet  2 times daily     06/17/19 1629           Raeford Razor, MD 06/18/19 1009

## 2020-04-25 ENCOUNTER — Emergency Department (HOSPITAL_COMMUNITY)
Admission: EM | Admit: 2020-04-25 | Discharge: 2020-04-25 | Disposition: A | Discharge status: Home / Self Care | Payer: No Typology Code available for payment source | Attending: Emergency Medicine | Admitting: Emergency Medicine

## 2020-04-25 ENCOUNTER — Emergency Department (HOSPITAL_COMMUNITY): Payer: No Typology Code available for payment source

## 2020-04-25 ENCOUNTER — Encounter (HOSPITAL_COMMUNITY): Payer: Self-pay

## 2020-04-25 ENCOUNTER — Other Ambulatory Visit: Payer: Self-pay

## 2020-04-25 DIAGNOSIS — M25511 Pain in right shoulder: Secondary | ICD-10-CM | POA: Insufficient documentation

## 2020-04-25 DIAGNOSIS — R0781 Pleurodynia: Secondary | ICD-10-CM | POA: Diagnosis not present

## 2020-04-25 DIAGNOSIS — R066 Hiccough: Secondary | ICD-10-CM | POA: Diagnosis not present

## 2020-04-25 DIAGNOSIS — R519 Headache, unspecified: Secondary | ICD-10-CM | POA: Insufficient documentation

## 2020-04-25 DIAGNOSIS — M545 Low back pain, unspecified: Secondary | ICD-10-CM | POA: Diagnosis not present

## 2020-04-25 DIAGNOSIS — Y92410 Unspecified street and highway as the place of occurrence of the external cause: Secondary | ICD-10-CM | POA: Insufficient documentation

## 2020-04-25 DIAGNOSIS — M542 Cervicalgia: Secondary | ICD-10-CM | POA: Diagnosis present

## 2020-04-25 MED ORDER — METOCLOPRAMIDE HCL 10 MG PO TABS
10.0000 mg | ORAL_TABLET | Freq: Once | ORAL | Status: AC
  Administered 2020-04-25: 10 mg via ORAL
  Filled 2020-04-25: qty 1

## 2020-04-25 MED ORDER — ACETAMINOPHEN 500 MG PO TABS
1000.0000 mg | ORAL_TABLET | Freq: Once | ORAL | Status: AC
  Administered 2020-04-25: 1000 mg via ORAL
  Filled 2020-04-25: qty 2

## 2020-04-25 MED ORDER — KETOROLAC TROMETHAMINE 30 MG/ML IJ SOLN
30.0000 mg | Freq: Once | INTRAMUSCULAR | Status: DC
  Filled 2020-04-25: qty 1

## 2020-04-25 MED ORDER — METHOCARBAMOL 500 MG PO TABS: 500.0000 mg | ORAL_TABLET | Freq: Two times a day (BID) | ORAL | 0 refills | Status: AC

## 2020-04-25 MED ORDER — METOCLOPRAMIDE HCL 10 MG PO TABS: 10.0000 mg | ORAL_TABLET | Freq: Four times a day (QID) | ORAL | 0 refills | Status: DC | PRN

## 2020-04-25 MED ORDER — IBUPROFEN 600 MG PO TABS: 600.0000 mg | ORAL_TABLET | Freq: Four times a day (QID) | ORAL | 0 refills | Status: AC | PRN
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
## 2020-04-25 NOTE — ED Notes (Signed)
DC instructions and scrips reviewed with the pt.  Pt verbalized understanding.  Pt DC.
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
## 2020-04-25 NOTE — Discharge Instructions (Signed)
The pain you are experiencing is likely due to muscle strain, you may take Ibuprofen and Robaxin as needed for pain management. Do not combine with any pain reliever other than tylenol.  You may also use ice and heat, and over-the-counter remedies such as Biofreeze gel or salon pas lidocaine patches. The muscle soreness should improve over the next week. Follow up with your family doctor in the next week for a recheck if you are still having symptoms. Return to ED if pain is worsening, you develop weakness or numbness of extremities, or new or concerning symptoms develop.  If your hiccups return later this evening you can take 1 additional dose of Reglan.
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
## 2020-08-14 ENCOUNTER — Encounter: Payer: Self-pay | Admitting: Neurology

## 2020-08-14 ENCOUNTER — Ambulatory Visit (INDEPENDENT_AMBULATORY_CARE_PROVIDER_SITE_OTHER): Payer: 59 | Admitting: Neurology

## 2020-08-14 VITALS — BP 127/85 | HR 64 | Ht 74.5 in | Wt 237.0 lb

## 2020-08-14 DIAGNOSIS — G44329 Chronic post-traumatic headache, not intractable: Secondary | ICD-10-CM | POA: Diagnosis not present

## 2020-08-14 DIAGNOSIS — M542 Cervicalgia: Secondary | ICD-10-CM

## 2020-08-14 DIAGNOSIS — G44309 Post-traumatic headache, unspecified, not intractable: Secondary | ICD-10-CM | POA: Insufficient documentation

## 2020-08-14 DIAGNOSIS — M5481 Occipital neuralgia: Secondary | ICD-10-CM | POA: Diagnosis not present

## 2020-08-14 MED ORDER — CYCLOBENZAPRINE HCL 10 MG PO TABS: 10.0000 mg | ORAL_TABLET | Freq: Every day | ORAL | 3 refills | Status: AC

## 2020-08-14 MED ORDER — METHYLPREDNISOLONE 4 MG PO TBPK: ORAL_TABLET | ORAL | 1 refills | Status: AC
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
